# Patient Record
Sex: Male | Born: 1965 | Hispanic: Yes | Marital: Married | Smoking: Never smoker
Health system: Southern US, Community
[De-identification: ages and names within clinical notes are randomized; demographics above are authoritative.]

## PROBLEM LIST (undated history)

## (undated) DIAGNOSIS — Z8619 Personal history of other infectious and parasitic diseases: Secondary | ICD-10-CM

## (undated) DIAGNOSIS — F419 Anxiety disorder, unspecified: Secondary | ICD-10-CM

---
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
## 2019-02-07 DIAGNOSIS — Z8616 Personal history of COVID-19: Secondary | ICD-10-CM

## 2019-02-07 HISTORY — DX: Personal history of COVID-19: Z86.16

## 2019-03-27 ENCOUNTER — Emergency Department
Admission: EM | Admit: 2019-03-27 | Discharge: 2019-03-27 | Disposition: A | Discharge status: Home / Self Care | Payer: Self-pay | Attending: Emergency Medicine | Admitting: Emergency Medicine

## 2019-03-27 ENCOUNTER — Other Ambulatory Visit: Payer: Self-pay

## 2019-03-27 ENCOUNTER — Emergency Department: Payer: Self-pay

## 2019-03-27 ENCOUNTER — Encounter: Payer: Self-pay | Admitting: Emergency Medicine

## 2019-03-27 DIAGNOSIS — G4733 Obstructive sleep apnea (adult) (pediatric): Secondary | ICD-10-CM | POA: Insufficient documentation

## 2019-03-27 DIAGNOSIS — F410 Panic disorder [episodic paroxysmal anxiety] without agoraphobia: Secondary | ICD-10-CM | POA: Insufficient documentation

## 2019-03-27 LAB — CBC WITH DIFFERENTIAL/PLATELET
Abs Immature Granulocytes: 0.03 10*3/uL (ref 0.00–0.07)
Basophils Absolute: 0.1 10*3/uL (ref 0.0–0.1)
Basophils Relative: 1 %
Eosinophils Absolute: 0.1 10*3/uL (ref 0.0–0.5)
Eosinophils Relative: 1 %
HCT: 44.1 % (ref 39.0–52.0)
Hemoglobin: 15.5 g/dL (ref 13.0–17.0)
Immature Granulocytes: 0 %
Lymphocytes Relative: 30 %
Lymphs Abs: 2.2 10*3/uL (ref 0.7–4.0)
MCH: 30.9 pg (ref 26.0–34.0)
MCHC: 35.1 g/dL (ref 30.0–36.0)
MCV: 87.8 fL (ref 80.0–100.0)
Monocytes Absolute: 0.6 10*3/uL (ref 0.1–1.0)
Monocytes Relative: 8 %
Neutro Abs: 4.6 10*3/uL (ref 1.7–7.7)
Neutrophils Relative %: 60 %
Platelets: 209 10*3/uL (ref 150–400)
RBC: 5.02 MIL/uL (ref 4.22–5.81)
RDW: 13 % (ref 11.5–15.5)
WBC: 7.5 10*3/uL (ref 4.0–10.5)
nRBC: 0 % (ref 0.0–0.2)

## 2019-03-27 LAB — COMPREHENSIVE METABOLIC PANEL
ALT: 26 U/L (ref 0–44)
AST: 17 U/L (ref 15–41)
Albumin: 4.4 g/dL (ref 3.5–5.0)
Alkaline Phosphatase: 47 U/L (ref 38–126)
Anion gap: 6 (ref 5–15)
BUN: 14 mg/dL (ref 6–20)
CO2: 24 mmol/L (ref 22–32)
Calcium: 8.9 mg/dL (ref 8.9–10.3)
Chloride: 105 mmol/L (ref 98–111)
Creatinine, Ser: 1.08 mg/dL (ref 0.61–1.24)
GFR calc Af Amer: >60 mL/min (ref >60)
GFR calc non Af Amer: >60 mL/min (ref >60)
Glucose, Bld: 104 mg/dL — ABNORMAL HIGH (ref 70–99)
Potassium: 3.6 mmol/L (ref 3.5–5.1)
Sodium: 135 mmol/L (ref 135–145)
Total Bilirubin: 1.4 mg/dL — ABNORMAL HIGH (ref 0.3–1.2)
Total Protein: 6.6 g/dL (ref 6.5–8.1)

## 2019-03-27 LAB — TROPONIN I (HIGH SENSITIVITY)
Troponin I (High Sensitivity): 3 ng/L (ref <18)
Troponin I (High Sensitivity): 5 ng/L (ref <18)

## 2019-03-27 MED ORDER — HYDROXYZINE HCL 25 MG PO TABS: 25.0000 mg | ORAL_TABLET | Freq: Three times a day (TID) | ORAL | 0 refills | Status: AC | PRN

## 2019-03-27 MED ORDER — HYDROXYZINE HCL 25 MG PO TABS
25.0000 mg | ORAL_TABLET | Freq: Once | ORAL | Status: AC
  Administered 2019-03-27: 25 mg via ORAL
  Filled 2019-03-27: qty 1

## 2019-03-27 NOTE — ED Provider Notes (Signed)
6:59 AM Assumed care for off going team.   Blood pressure 117/84, pulse (!) 56, temperature 97.9 F (36.6 C), temperature source Oral, resp. rate (!) 9, height 5\' 7"  (1.702 m), weight 72.6 kg, SpO2 100 %.  See their HPI for full report but in brief   Repeat trop 0700. Covid 5 weeks ago. Waking up and having some SOB. Buspar and ativan given by PCP. Given hydroxyzine and feels better.  Plan for repeat trop and if stable then d/c home.    Chest x-ray negative. Repeat Trop 3-->5.   Per algorithm ruled out for ACS.  8:30 AM re-evaluated pt. updated patient on results.  Patient sating 100%.  Patient feels comfortable with discharge home with hydroxyzine.  Patient will follow-up with her primary care doctor.  I discussed the provisional nature of ED diagnosis, the treatment so far, the ongoing plan of care, follow up appointments and return precautions with the patient and any family or support people present. They expressed understanding and agreed with the plan, discharged home.         Vanessa [place], MD 03/27/19 [phone]

## 2019-03-27 NOTE — ED Notes (Signed)
Patient states that since he was dx with Covid 19 in July he has been having issues with getting scared. When asked about anxiety, pt states, "Do I have anxiety?" Patient states that he takes ativan and is scared he will become addicted.

## 2019-03-27 NOTE — ED Triage Notes (Signed)
Pt reports wearing CPAP machine and waking this AM with sudden onset of SOB. Pt reports he was COVID positive in July but has not had SOB, fever or cough in last week.

## 2019-03-27 NOTE — ED Provider Notes (Signed)
[facility] Emergency Department Provider Note  ____________________________________________  Time seen: Approximately 5:07 AM  I have reviewed the triage vital signs and the nursing notes.   HISTORY  Chief Complaint Shortness of Breath   HPI Phillip Sawyer is a 53 y.o. male with h/o anxiety and OSA on CPAP who presents for evaluation of chest pain and shortness of breath.  Patient reports that he has been struggling with severe anxiety since being diagnosed with COVID-19 5 weeks ago.  He reports that he had 4 days of a cough and fever but has fully recovered from that.  He has been having episodes where he gets extremely anxious, has difficulty breathing and has chest pain.  The episode that brings him to the emergency room this evening is similar.  He reports that he was sleeping with his CPAP machine when he woke up and was having difficulty breathing. He became very anxious and started to feel severe pressure in the center of his chest.  No personal family history of heart attacks, PE or DVT, recent travel immobilization, leg pain or swelling, hemoptysis, exogenous hormones.  No fever, no pleuritic chest pain, no pain radiating to his back, no numbness or weakness of his extremities.  Patient has had no cough or fever for several weeks.  Patient reports that he saw his PCP due to his frequent episodes of panic attack/anxiety attacks and was started on Buspar and Ativan. Has been taking the Buspar as prescribed but he is concerned about taking Ativan and becoming addicted to it so he has not been taking it as prescribed.  PMH Anxiety, OSA  Allergies Patient has no known allergies.  FH No h/o CAD, PE or DVT  Social History Social History   Tobacco Use  . Smoking status: Never Smoker  . Smokeless tobacco: Never Used  Substance Use Topics  . Alcohol use: Not on file  . Drug use: Not on file    Review of Systems  Constitutional: Negative for  fever. Eyes: Negative for visual changes. ENT: Negative for sore throat. Neck: No neck pain  Cardiovascular: + chest pain. Respiratory: + shortness of breath. Gastrointestinal: Negative for abdominal pain, vomiting or diarrhea. Genitourinary: Negative for dysuria. Musculoskeletal: Negative for back pain. Skin: Negative for rash. Neurological: Negative for headaches, weakness or numbness. Psych: No SI or HI  ____________________________________________   PHYSICAL EXAM:  VITAL SIGNS: ED Triage Vitals  Enc Vitals Group     BP 03/27/19 0439 (!) 144/95     Pulse Rate 03/27/19 0439 67     Resp 03/27/19 0439 20     Temp 03/27/19 0439 97.9 F (36.6 C)     Temp Source 03/27/19 0439 Oral     SpO2 03/27/19 0439 100 %     Weight 03/27/19 0438 160 lb (72.6 kg)     Height 03/27/19 0438 5\' 7"  (1.702 m)     Head Circumference --      Peak Flow --      Pain Score 03/27/19 0438 0     Pain Loc --      Pain Edu? --      Excl. in [place]? --     Constitutional: Alert and oriented, extremely anxious but in no distress.  HEENT:      Head: Normocephalic and atraumatic.         Eyes: Conjunctivae are normal. Sclera is non-icteric.       Mouth/Throat: Mucous membranes are moist.  Neck: Supple with no signs of meningismus. Cardiovascular: Regular rate and rhythm. No murmurs, gallops, or rubs. 2+ symmetrical distal pulses are present in all extremities. No JVD. Respiratory: Normal respiratory effort. Lungs are clear to auscultation bilaterally. No wheezes, crackles, or rhonchi.  Gastrointestinal: Soft, non tender, and non distended with positive bowel sounds. No rebound or guarding. Musculoskeletal: Nontender with normal range of motion in all extremities. No edema, cyanosis, or erythema of extremities. Neurologic: Normal speech and language. Face is symmetric. Moving all extremities. No gross focal neurologic deficits are appreciated. Skin: Skin is warm, dry and intact. No rash noted.  Psychiatric: Mood and affect are normal. Speech and behavior are normal.  ____________________________________________   LABS (all labs ordered are listed, but only abnormal results are displayed)  Labs Reviewed  COMPREHENSIVE METABOLIC PANEL - Abnormal; Notable for the following components:      Result Value   Glucose, Bld 104 (*)    Total Bilirubin 1.4 (*)    All other components within normal limits  CBC WITH DIFFERENTIAL/PLATELET  TROPONIN I (HIGH SENSITIVITY)  TROPONIN I (HIGH SENSITIVITY)   ____________________________________________  EKG  ED ECG REPORT I, Rudene Re, the attending physician, personally viewed and interpreted this ECG.   Normal sinus rhythm, rate of 77, normal intervals, normal axis, minimal diffuse ST elevations on inferior and lateral leads with no reciprocal changes.  No prior for comparison. ____________________________________________  RADIOLOGY  I have personally reviewed the images performed during this visit and I agree with the Radiologist's read.   Interpretation by Radiologist:  Dg Chest Portable 1 View  Result Date: 03/27/2019 CLINICAL DATA:  Shortness of breath EXAM: PORTABLE CHEST 1 VIEW COMPARISON:  None. FINDINGS: Normal heart size and mediastinal contours. Linear density over the right base is likely artifact given extent and orientation. No acute infiltrate or edema. No effusion or pneumothorax. No acute osseous findings. IMPRESSION: No active disease. Electronically Signed   By: Monte Fantasia M.D.   On: 03/27/2019 05:12      ____________________________________________   PROCEDURES  Procedure(s) performed: None Procedures Critical Care performed:  None ____________________________________________   INITIAL IMPRESSION / ASSESSMENT AND PLAN / ED COURSE  53 y.o. male with h/o anxiety and OSA on CPAP who presents for evaluation of chest pressure and shortness of breath.  Patient seems to have been struggling with  anxiety since being diagnosed with COVID-19 5 weeks ago.  He looks extremely anxious but he is in otherwise no respiratory distress, normal work of breathing, normal sats, lungs are clear to auscultation.  EKG showing no acute ischemic changes.  Will get a chest x-ray to rule out pneumonia, pulmonary edema, pneumothorax, pleural effusion.  Will monitor on telemetry for dysrhythmias.  Will cycle cardiac enzymes to further stratify ACS although low suspicion.  Will check labs to rule out anemia.  Will give hydroxyzine for anxiety  Clinical Course as of Mar 26 640  Tue Mar 27, 2019  0638 Patient symptoms fully resolved after a 25 mg of hydroxyzine.  Second troponin is pending.  Care transferred to incoming doctor at 7 AM.  Plan to continue BuSpar and switch Ativan to hydroxyzine since his last addictive.  Recommended close follow-up with his doctor for further management.  Discussed my standard return precautions.   [CV]    Clinical Course User Index [CV] Alfred Levins [place], MD      As part of my medical decision making, I reviewed the following data within the [place] notes reviewed  and incorporated, Labs reviewed , EKG interpreted , Old EKG reviewed, Old chart reviewed, Radiograph reviewed , Notes from prior ED visits and [place] Controlled Substance Database   Patient was evaluated in Emergency Department today for the symptoms described in the history of present illness. Patient was evaluated in the context of the global COVID-19 pandemic, which necessitated consideration that the patient might be at risk for infection with the SARS-CoV-2 virus that causes COVID-19. Institutional protocols and algorithms that pertain to the evaluation of patients at risk for COVID-19 are in a state of rapid change based on information released by regulatory bodies including the CDC and federal and state organizations. These policies and algorithms were followed during the patient's care in  the ED.   ____________________________________________   FINAL CLINICAL IMPRESSION(S) / ED DIAGNOSES   Final diagnoses:  Anxiety attack      NEW MEDICATIONS STARTED DURING THIS VISIT:  ED Discharge Orders         Ordered    hydrOXYzine (ATARAX/VISTARIL) 25 MG tablet  3 times daily PRN     03/27/19 0641           Note:  This document was prepared using Dragon voice recognition software and may include unintentional dictation errors.    Don PerkingVeronese, [place], MD 03/27/19 [phone]

## 2019-03-27 NOTE — Discharge Instructions (Addendum)
Continue to take the BuSpar as prescribed.  Take hydroxyzine as needed for severe anxiety.  Avoid taking Ativan as it causes sedation and addiction.  Follow-up with your primary care doctor.  Return to the emergency room for new or worsening chest pain, shortness of breath, fever.

## 2019-04-10 ENCOUNTER — Other Ambulatory Visit: Payer: Self-pay

## 2019-04-10 ENCOUNTER — Encounter: Payer: Self-pay | Admitting: Emergency Medicine

## 2019-04-10 DIAGNOSIS — M549 Dorsalgia, unspecified: Secondary | ICD-10-CM | POA: Insufficient documentation

## 2019-04-10 DIAGNOSIS — K5900 Constipation, unspecified: Secondary | ICD-10-CM | POA: Insufficient documentation

## 2019-04-10 DIAGNOSIS — Z79899 Other long term (current) drug therapy: Secondary | ICD-10-CM | POA: Insufficient documentation

## 2019-04-10 LAB — URINALYSIS, COMPLETE (UACMP) WITH MICROSCOPIC
Bacteria, UA: NONE SEEN
Bilirubin Urine: NEGATIVE
Glucose, UA: NEGATIVE mg/dL
Hgb urine dipstick: NEGATIVE
Ketones, ur: NEGATIVE mg/dL
Leukocytes,Ua: NEGATIVE
Nitrite: NEGATIVE
Protein, ur: NEGATIVE mg/dL
Specific Gravity, Urine: 1.002 — ABNORMAL LOW (ref 1.005–1.030)
Squamous Epithelial / HPF: NONE SEEN (ref 0–5)
pH: 7 (ref 5.0–8.0)

## 2019-04-10 LAB — COMPREHENSIVE METABOLIC PANEL
ALT: 26 U/L (ref 0–44)
AST: 18 U/L (ref 15–41)
Albumin: 4.7 g/dL (ref 3.5–5.0)
Alkaline Phosphatase: 53 U/L (ref 38–126)
Anion gap: 9 (ref 5–15)
BUN: 12 mg/dL (ref 6–20)
CO2: 26 mmol/L (ref 22–32)
Calcium: 9 mg/dL (ref 8.9–10.3)
Chloride: 102 mmol/L (ref 98–111)
Creatinine, Ser: 0.88 mg/dL (ref 0.61–1.24)
GFR calc Af Amer: >60 mL/min (ref >60)
GFR calc non Af Amer: >60 mL/min (ref >60)
Glucose, Bld: 116 mg/dL — ABNORMAL HIGH (ref 70–99)
Potassium: 4 mmol/L (ref 3.5–5.1)
Sodium: 137 mmol/L (ref 135–145)
Total Bilirubin: 1 mg/dL (ref 0.3–1.2)
Total Protein: 7.1 g/dL (ref 6.5–8.1)

## 2019-04-10 LAB — LIPASE, BLOOD: Lipase: 27 U/L (ref 11–51)

## 2019-04-10 NOTE — ED Triage Notes (Addendum)
Pt arrived via POV with reports of LUQ abdominal pain that radiated to his back.  Pt states the abdominal pain has improved, but the back pain is worse.  Pt was prescribed stool softeners and took fiber this afternoon but no relief.  Pt had small BM today.   Pt was COVID positive in July.

## 2019-04-10 NOTE — ED Notes (Signed)
Lab results reviewed

## 2019-04-11 ENCOUNTER — Emergency Department
Admission: EM | Admit: 2019-04-11 | Discharge: 2019-04-11 | Disposition: A | Discharge status: Home / Self Care | Payer: Self-pay | Attending: Emergency Medicine | Admitting: Emergency Medicine

## 2019-04-11 ENCOUNTER — Encounter: Payer: Self-pay | Admitting: Emergency Medicine

## 2019-04-11 ENCOUNTER — Emergency Department: Payer: Self-pay

## 2019-04-11 DIAGNOSIS — K5900 Constipation, unspecified: Secondary | ICD-10-CM

## 2019-04-11 DIAGNOSIS — M549 Dorsalgia, unspecified: Secondary | ICD-10-CM

## 2019-04-11 DIAGNOSIS — R1030 Lower abdominal pain, unspecified: Secondary | ICD-10-CM

## 2019-04-11 HISTORY — DX: Anxiety disorder, unspecified: F41.9

## 2019-04-11 MED ORDER — IOHEXOL 300 MG/ML  SOLN
100.0000 mL | Freq: Once | INTRAMUSCULAR | Status: AC | PRN
  Administered 2019-04-11: 04:00:00 100 mL via INTRAVENOUS

## 2019-04-11 MED ORDER — IOHEXOL 240 MG/ML SOLN
50.0000 mL | Freq: Once | INTRAMUSCULAR | Status: AC
  Administered 2019-04-11: 03:00:00 50 mL via ORAL

## 2019-04-11 MED ORDER — LACTULOSE 10 GM/15ML PO SOLN
30.0000 g | Freq: Once | ORAL | Status: AC
  Administered 2019-04-11: 30 g via ORAL
  Filled 2019-04-11: qty 60

## 2019-04-11 MED ORDER — KETOROLAC TROMETHAMINE 30 MG/ML IJ SOLN
INTRAMUSCULAR | Status: AC
  Filled 2019-04-11: qty 1

## 2019-04-11 MED ORDER — KETOROLAC TROMETHAMINE 30 MG/ML IJ SOLN
15.0000 mg | Freq: Once | INTRAMUSCULAR | Status: AC
  Administered 2019-04-11: 02:00:00 15 mg via INTRAVENOUS

## 2019-04-11 MED ORDER — ACETAMINOPHEN 325 MG PO TABS
650.0000 mg | ORAL_TABLET | Freq: Once | ORAL | Status: AC
  Administered 2019-04-11: 650 mg via ORAL
  Filled 2019-04-11: qty 2

## 2019-04-11 NOTE — ED Notes (Signed)
Pt to the ER for bilateral back pain at mid back that moves up to his shoulders. Pt says the pain is worse with movement. Pt says he saw his doctor today and they did not do an xray and they told him if it got worse to go to the ER.

## 2019-04-11 NOTE — ED Provider Notes (Addendum)
[facility] Emergency Department Provider Note  ____________________________________________   First MD Initiated Contact with Patient 04/11/19 0123     (approximate)  I have reviewed the triage vital signs and the nursing notes.   HISTORY  Chief Complaint Abdominal Pain and Back Pain    HPI Phillip Sawyer is a 53 y.o. male with medical history as listed below who presents for evaluation of about 24 hours of left lower quadrant abdominal pain that is now hurting on both sides and radiated to both sides of his back.  He says he has been constipated may be from taking multiple anxiety medications.  He is not having a bowel movement since last week.  No nausea or vomiting.  He says the pain is severe, aching, nothing particular makes it better or worse.  No history of kidney stones, no blood in his urine, no dysuria.  His respiratory symptoms after COVID-19 infection about 2 months ago completely resolved.  He denies fever/chills, sore throat, chest pain, cough, shortness of breath, nausea, vomiting.  He is very uncomfortable and cannot seem to hold still and when I entered the room he was kneeling on the floor next to the bed.         Past Medical History:  Diagnosis Date   Anxiety    History of 2019 novel coronavirus disease (COVID-19) 02/2019   July 2020    There are no active problems to display for this patient.   History reviewed. No pertinent surgical history.  Prior to Admission medications   Medication Sig Start Date End Date Taking? Authorizing Provider  hydrOXYzine (ATARAX/VISTARIL) 25 MG tablet Take 1 tablet (25 mg total) by mouth 3 (three) times daily as needed for anxiety. 03/27/19   Rudene Re, MD    Allergies Patient has no known allergies.  History reviewed. No pertinent family history.  Social History Social History   Tobacco Use   Smoking status: Never Smoker   Smokeless tobacco: Never Used  Substance Use  Topics   Alcohol use: Not on file   Drug use: Not on file    Review of Systems Constitutional: No fever/chills Eyes: No visual changes. ENT: No sore throat. Cardiovascular: Denies chest pain. Respiratory: Denies shortness of breath. Gastrointestinal: Left lower quadrant abdominal pain radiating to now both sides of his lower abdomen and into his back. Genitourinary: Negative for dysuria. Musculoskeletal: Negative for neck pain.  Negative for back pain. Integumentary: Negative for rash. Neurological: Negative for headaches, focal weakness or numbness.   ____________________________________________   PHYSICAL EXAM:  VITAL SIGNS: ED Triage Vitals  Enc Vitals Group     BP 04/10/19 2126 (!) 140/94     Pulse Rate 04/10/19 2126 70     Resp 04/10/19 2126 16     Temp 04/10/19 2126 98.3 F (36.8 C)     Temp Source 04/10/19 2126 Oral     SpO2 04/10/19 2126 96 %     Weight 04/10/19 2134 72.6 kg (160 lb)     Height 04/10/19 2134 1.702 m (5\' 7" )     Head Circumference --      Peak Flow --      Pain Score 04/10/19 2129 8     Pain Loc --      Pain Edu? --      Excl. in [place]? --     Constitutional: Alert and oriented.  Appears uncomfortable. Eyes: Conjunctivae are normal.  Head: Atraumatic. Nose: No congestion/rhinnorhea. Mouth/Throat: Mucous membranes are  moist. Neck: No stridor.  No meningeal signs.   Cardiovascular: Normal rate, regular rhythm. Good peripheral circulation. Grossly normal heart sounds. Respiratory: Normal respiratory effort.  No retractions. Gastrointestinal: Soft and nontender.  Nondistended. Musculoskeletal: No lower extremity tenderness nor edema. No gross deformities of extremities. Neurologic:  Normal speech and language. No gross focal neurologic deficits are appreciated.  Skin:  Skin is warm, dry and intact. Psychiatric: Mood and affect are anxious but generally appropriate under the circumstances.  ____________________________________________     LABS (all labs ordered are listed, but only abnormal results are displayed)  Labs Reviewed  COMPREHENSIVE METABOLIC PANEL - Abnormal; Notable for the following components:      Result Value   Glucose, Bld 116 (*)    All other components within normal limits  CBC - Abnormal; Notable for the following components:   HCT 59.6 (*)    MCV 113.7 (*)    MCHC 27.3 (*)    All other components within normal limits  URINALYSIS, COMPLETE (UACMP) WITH MICROSCOPIC - Abnormal; Notable for the following components:   Color, Urine COLORLESS (*)    APPearance CLEAR (*)    Specific Gravity, Urine 1.002 (*)    All other components within normal limits  LIPASE, BLOOD   ____________________________________________  EKG  ED ECG REPORT I, Loleta Roseory Lanora Reveron, the attending physician, personally viewed and interpreted this ECG.  Date: 04/10/2019 EKG Time: 21:36 Rate: 67 Rhythm: normal sinus rhythm QRS Axis: normal Intervals: normal ST/T Wave abnormalities: normal Narrative Interpretation: no evidence of acute ischemia  ____________________________________________  RADIOLOGY I, Loleta Roseory Mahayla [place]da[place], personally viewed and evaluated these images (plain radiographs) as part of my medical decision making, as well as reviewing the written report by the radiologist.  ED MD interpretation: Moderate stool burden, otherwise unremarkable.  Official radiology report(s): Dg Abdomen 1 View  Result Date: 04/11/2019 CLINICAL DATA:  Constipation. No bowel movement for 3 days. EXAM: ABDOMEN - 1 VIEW COMPARISON:  None. FINDINGS: No bowel dilatation to suggest obstruction. Moderate stool burden in the colon. No abnormal rectal distention. No radiopaque calculi or abnormal soft tissue calcifications. No acute osseous abnormalities are seen. IMPRESSION: Moderate stool burden in the colon. No abnormal rectal distention or bowel obstruction. Electronically Signed   By: Narda RutherfordMelanie  Sanford M.D.   On: 04/11/2019 00:43   Ct Abdomen  Pelvis W Contrast  Result Date: 04/11/2019 CLINICAL DATA:  Constipation Abd pain, diverticulitis suspected Abd pain, acute, generalized. The patient was COVID-19 positive 2 months ago. EXAM: CT ABDOMEN AND PELVIS WITH CONTRAST TECHNIQUE: Multidetector CT imaging of the abdomen and pelvis was performed using the standard protocol following bolus administration of intravenous contrast. CONTRAST:  100mL OMNIPAQUE IOHEXOL 300 MG/ML  SOLN COMPARISON:  None. FINDINGS: Lower chest: Mild dependent atelectasis is present bilaterally. Linear opacities may represent some scarring or atelectasis at the bases. There is no significant consolidation. The heart size is normal. No significant pleural or pericardial effusion is present. Hepatobiliary: No focal liver abnormality is seen. No gallstones, gallbladder wall thickening, or biliary dilatation. Pancreas: Unremarkable. No pancreatic ductal dilatation or surrounding inflammatory changes. Spleen: Normal in size without focal abnormality. Adrenals/Urinary Tract: Adrenal glands are normal bilaterally. Kidneys in ureters are within normal limits. The urinary bladder is normal. Stomach/Bowel: The stomach and duodenum are within normal limits. Small bowel is unremarkable. Terminal ileum is within normal limits. Appendix is visualized and normal. The ascending and transverse colon are normal. Descending and sigmoid colon are within normal limits. Vascular/Lymphatic: Atherosclerotic calcifications are present in  the distal aorta and proximal iliac arteries without aneurysm or stenosis. No significant retroperitoneal adenopathy is present. Reproductive: Dense central calcifications are present within the prostate gland. The gland is normal in size. Other: Fat herniates into the left inguinal canal. There is no associated bowel. No other significant ventral hernia is present. There is no free fluid. Musculoskeletal: Degenerative disc disease is present in the lower lumbar spine. There  is foraminal narrowing bilaterally at L4-5 and L5-S1. Bilateral L5 pars defects are present. No acute osseous abnormality is present. IMPRESSION: 1. Acute or focal abnormality to explain abdominal pain. 2. Degenerative disc disease in the lower lumbar spine with bilateral foraminal narrowing at L4-5 and L5-S1. 3.  Aortic Atherosclerosis (ICD10-I70.0). 4. Fat herniates into the left inguinal canal. Electronically Signed   By: Marin Robertshristopher  Mattern M.D.   On: 04/11/2019 04:34    ____________________________________________   PROCEDURES   Procedure(s) performed (including Critical Care):  Procedures   ____________________________________________   INITIAL IMPRESSION / MDM / ASSESSMENT AND PLAN / ED COURSE  As part of my medical decision making, I reviewed the following data within the electronic MEDICAL RECORD NUMBER Nursing notes reviewed and incorporated, Labs reviewed , Old chart reviewed, Radiograph reviewed , Notes from prior ED visits and [place] Controlled Substance Database   Differential diagnosis includes, but is not limited to, abdominal pain secondary constipation, diverticulitis, appendicitis, biliary disease, renal/ureteral colic.  Patient is acting like someone who is suffering from a kidney stone given his discomfort and inability to hold still.  However he has not had a bowel movement for a week and has a moderate stool burden on the plain films.  I am reluctant to give him narcotics because I am afraid this will only worsen his constipation so we will give a dose of Toradol 15 mg IV.  Given the probability the constipation is playing a role I am getting a CT scan with oral and IV contrast which should help also rule out diverticulitis.  It should also identify a ureteral stone if one is present.  The patient understands and agrees with the plan.  His lab results are all reassuring with no leukocytosis and a normal comprehensive metabolic panel.  His urinalysis was also reassuring with no  evidence of hematuria.      Clinical Course as of Apr 10 622  Wed Apr 11, 2019  [id number] Unremarkable and reassuring CT scan of the abdomen and pelvis with no evidence of an emergent medical condition or any clear cause of the patient's pain.  His work-up is been reassuring thus far.  I am giving him a dose of lactulose 30g PO to help with constipation.  CT ABDOMEN PELVIS W CONTRAST [CF]  [id number] I have dated the patient who was resting quietly when I entered the room.  I updated him about the good news and the reassuring results.  He says he is still having abdominal pain and now he says that he has pain up into the middle of his upper back.  I explained that he has been in the ED for 8 hours and has had a very reassuring work-up and there is no indication of an emergent medical condition at this time.  He is having no signs or symptoms of an acute or emergent spine issue with no neurological deficits and he has no signs or symptoms of an infectious process.  I explained that we are going to give lactulose 30 g by mouth to help him with his constipation  and that anticipate he will feel better.  He assured me that he would come back to the ED if he is not feeling better after he has a bowel movement.  I explained why I am not able to give narcotic pain medicine at this time, because this would only worsen his constipation which I believe is the proximal cause of his pain.  He states that he understands and agrees.  He is ambulatory without difficulty with no neurological deficits and no history of trauma.   [CF]    Clinical Course User Index [CF] Loleta Rose, MD     ____________________________________________  FINAL CLINICAL IMPRESSION(S) / ED DIAGNOSES  Final diagnoses:  Constipation, unspecified constipation type  Lower abdominal pain  Acute back pain, unspecified back location, unspecified back pain laterality     MEDICATIONS GIVEN DURING THIS VISIT:  Medications  acetaminophen  (TYLENOL) tablet 650 mg (650 mg Oral Given 04/11/19 0002)  ketorolac (TORADOL) 30 MG/ML injection 15 mg (15 mg Intravenous Given 04/11/19 0226)  iohexol (OMNIPAQUE) 240 MG/ML injection 50 mL (50 mLs Oral Contrast Given 04/11/19 0230)  iohexol (OMNIPAQUE) 300 MG/ML solution 100 mL (100 mLs Intravenous Contrast Given 04/11/19 0352)  lactulose (CHRONULAC) 10 GM/15ML solution 30 g (30 g Oral Given 04/11/19 0523)     ED Discharge Orders    None      *Please note:  Bogar Dopson was evaluated in Emergency Department on 04/11/2019 for the symptoms described in the history of present illness. He was evaluated in the context of the global COVID-19 pandemic, which necessitated consideration that the patient might be at risk for infection with the SARS-CoV-2 virus that causes COVID-19. Institutional protocols and algorithms that pertain to the evaluation of patients at risk for COVID-19 are in a state of rapid change based on information released by regulatory bodies including the CDC and federal and state organizations. These policies and algorithms were followed during the patient's care in the ED.  Some ED evaluations and interventions may be delayed as a result of limited staffing during the pandemic.*  Note:  This document was prepared using Dragon voice recognition software and may include unintentional dictation errors.   Loleta Rose, MD 04/11/19 0722    Loleta Rose, MD 04/17/19 1728

## 2019-04-11 NOTE — Discharge Instructions (Signed)
You were seen in the emergency department today for pain that we believe is due to constipation.  We recommend that you use one or more of the following over-the-counter medications in the order described:   1)  Colace (or Dulcolax) 100 mg:  This is a stool softener, and you may take it once or twice a day as needed. 2)  Senna tablets:  This is a bowel stimulant that will help "push" out your stool. It is the next step to add after you have tried a stool softener. 3)  Miralax (powder):  This medication works by drawing additional fluid into your intestines and helps to flush out your stool.  Mix the powder with water or juice according to label instructions.  It may help if the Colace and Senna are not sufficient, but you must be sure to use the recommended amount of water or juice when you mix up the powder. 4)  Look for magnesium citrate at the pharmacy (it is usually a small glass bottle).  Drink the bottle according to the label instructions.  Remember that narcotic pain medications are constipating, so avoid them or minimize their use.  Drink plenty of fluids.  Please return to the Emergency Department immediately if you develop new or worsening symptoms that concern you, such as (but not limited to) fever > 101 degrees, severe abdominal pain, or persistent vomiting.

## 2019-04-11 NOTE — ED Notes (Signed)
IV obtained, pt is anxious. Explained to pt he needs to stay in bed on the monitor and drink his contrast in order to get testing. Explained to pt that he needs to drink one bottle over 30 minutes and then the 2nd bottle over 30 minutes. Pt behavior is odd. Will continue to monitor.

## 2019-04-11 NOTE — ED Notes (Signed)
Pt reports increasing pain.

## 2019-04-11 NOTE — ED Notes (Signed)
Patient to front desk c/o increased/continued pain to back. Patient reports last adequate BM was 3 days ago, that he has "fullness" feeling like he is constipated.

## 2019-04-12 LAB — CBC
HCT: 59.6 % — ABNORMAL HIGH (ref 39.0–52.0)
Hemoglobin: 16.3 g/dL (ref 13.0–17.0)
MCH: 31.1 pg (ref 26.0–34.0)
MCHC: 27.3 g/dL — ABNORMAL LOW (ref 30.0–36.0)
MCV: 90.3 fL (ref 80.0–100.0)
Platelets: 226 10*3/uL (ref 150–400)
RBC: 5.24 MIL/uL (ref 4.22–5.81)
RDW: 11.6 % (ref 11.5–15.5)
WBC: 8.3 10*3/uL (ref 4.0–10.5)
nRBC: 0 % (ref 0.0–0.2)

## 2020-11-07 IMAGING — DX PORTABLE CHEST - 1 VIEW
1 series · 1 of 1 positions shown · non-contrast
Comparison: None.

CLINICAL DATA: Shortness of breath

EXAM:
PORTABLE CHEST 1 VIEW

[chest ap]
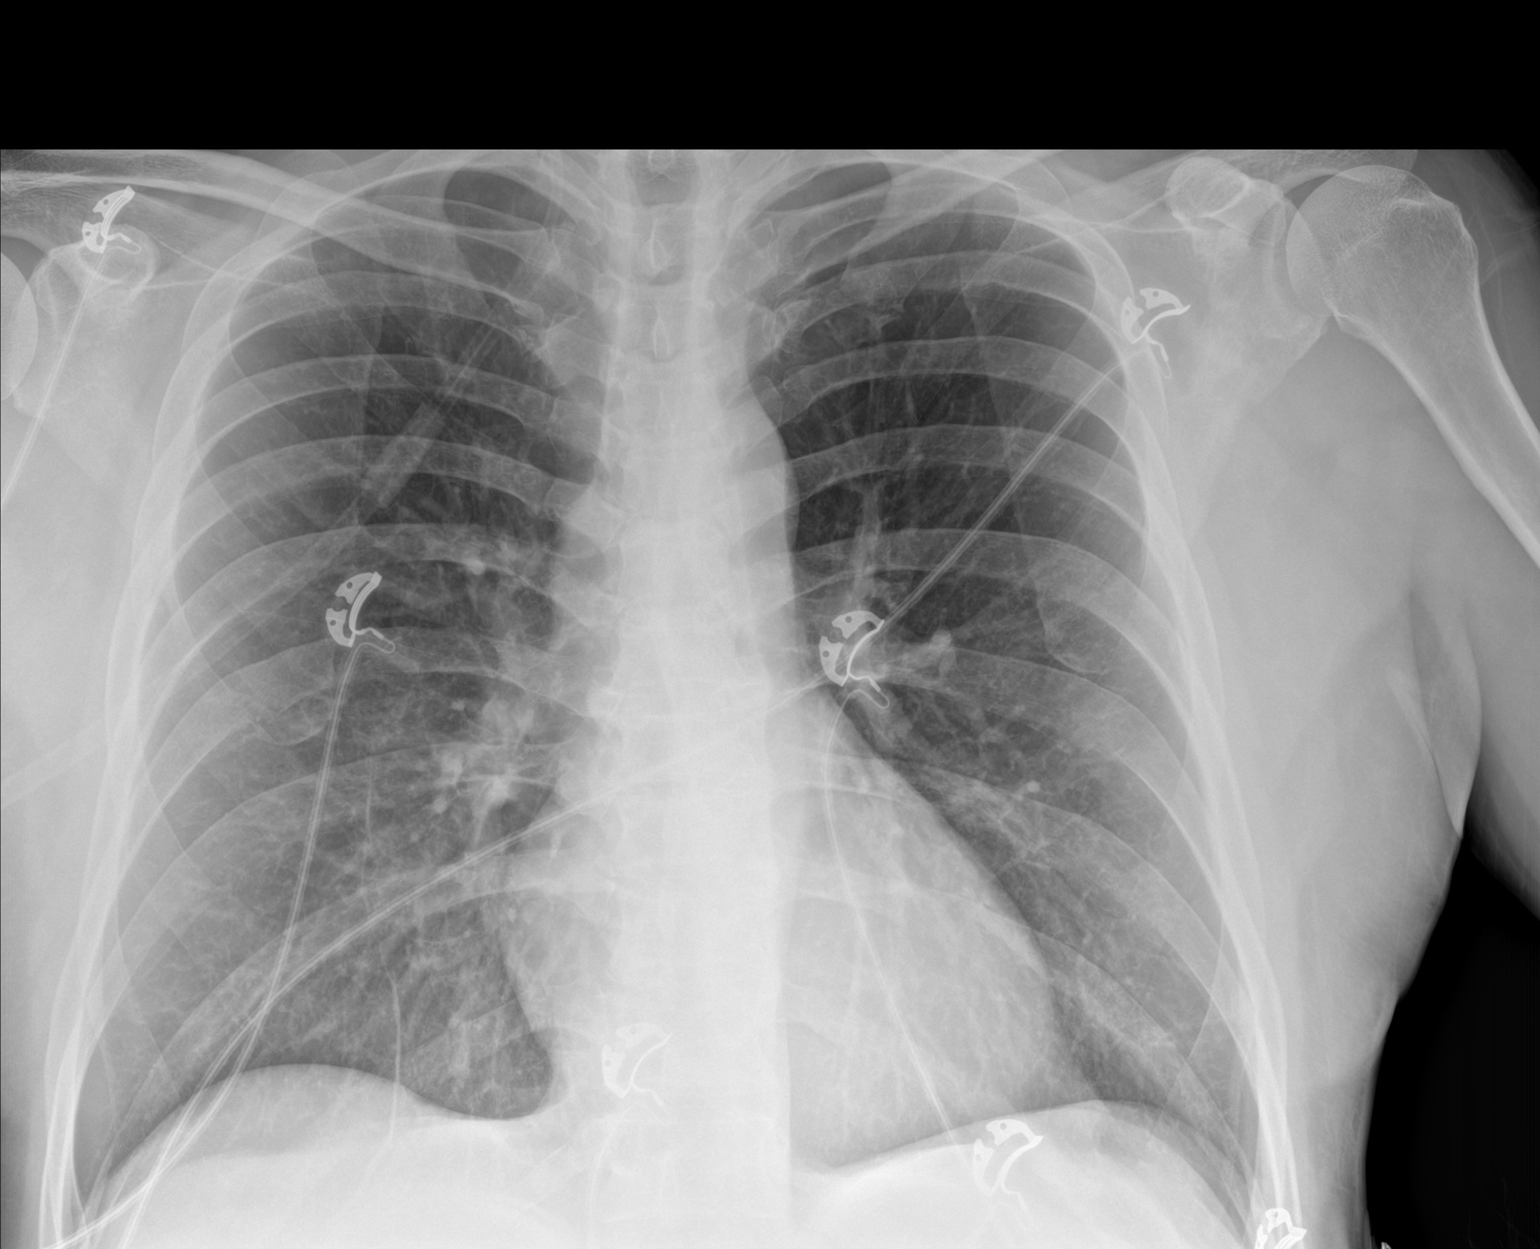

[1 of 1 positions shown; findings below may reference images not displayed]

FINDINGS: Normal heart size and mediastinal contours. Linear density over the
right base is likely artifact given extent and orientation. No acute
infiltrate or edema. No effusion or pneumothorax. No acute osseous
findings.
IMPRESSION: No active disease.
